# Patient Record
Sex: Female | Born: 1990 | Race: White | Hispanic: No | Marital: Single | Smoking: Never smoker
Health system: Southern US, Community
[De-identification: ages and names within clinical notes are randomized; demographics above are authoritative.]

## PROBLEM LIST (undated history)

## (undated) DIAGNOSIS — F419 Anxiety disorder, unspecified: Secondary | ICD-10-CM

## (undated) HISTORY — PX: WISDOM TOOTH EXTRACTION: SHX21

---

## 2020-07-20 ENCOUNTER — Other Ambulatory Visit: Payer: Self-pay

## 2020-07-20 ENCOUNTER — Encounter: Payer: Self-pay | Admitting: Emergency Medicine

## 2020-07-20 ENCOUNTER — Ambulatory Visit
Admission: EM | Admit: 2020-07-20 | Discharge: 2020-07-20 | Disposition: A | Discharge status: Home / Self Care | Payer: BC Managed Care – PPO

## 2020-07-20 ENCOUNTER — Ambulatory Visit (INDEPENDENT_AMBULATORY_CARE_PROVIDER_SITE_OTHER): Payer: BC Managed Care – PPO

## 2020-07-20 DIAGNOSIS — W19XXXA Unspecified fall, initial encounter: Secondary | ICD-10-CM | POA: Diagnosis not present

## 2020-07-20 DIAGNOSIS — S82145A Nondisplaced bicondylar fracture of left tibia, initial encounter for closed fracture: Secondary | ICD-10-CM | POA: Diagnosis not present

## 2020-07-20 DIAGNOSIS — M25571 Pain in right ankle and joints of right foot: Secondary | ICD-10-CM

## 2020-07-20 DIAGNOSIS — S8261XA Displaced fracture of lateral malleolus of right fibula, initial encounter for closed fracture: Secondary | ICD-10-CM

## 2020-07-20 DIAGNOSIS — T07XXXA Unspecified multiple injuries, initial encounter: Secondary | ICD-10-CM

## 2020-07-20 DIAGNOSIS — M25562 Pain in left knee: Secondary | ICD-10-CM | POA: Diagnosis not present

## 2020-07-20 HISTORY — DX: Anxiety disorder, unspecified: F41.9

## 2020-07-20 MED ORDER — TRAMADOL HCL 50 MG PO TABS: 100.0000 mg | ORAL_TABLET | Freq: Four times a day (QID) | ORAL | 0 refills | Status: AC | PRN

## 2020-07-20 NOTE — ED Triage Notes (Signed)
Patient states that she she was walking around the trail and fell down about 1 hour ago.  Patient c/o left leg pain, right ankle pain and has abrasions to left hand and right forearm.

## 2020-07-20 NOTE — Discharge Instructions (Addendum)
Wear your knee immobilizer to protect your left knee and do not bear weight as to prevent further injury.  You may bear weight as tolerated on your right ankle but wear your Aircast for stability.  Use your crutches to get around.  Keep your abrasions clean and dry.  Clean them daily with warm water and soap, pat them dry, and for the next 2 days apply bacitracin ointment and a nonstick dressing.  If you develop any increased pain, redness, swelling, or drainage from any of your abrasions return for reevaluation and possible antibiotic prescription.  Call Dr. Binnie Rail office tomorrow to schedule appointment.  Tell them that you were evaluated in the urgent care and that Dr. Joice Lofts wants to see you this week in clinic.

## 2020-07-20 NOTE — ED Provider Notes (Signed)
[facility]    CSN: [id number] Arrival date & time: 07/20/20  1145      History   Chief Complaint Chief Complaint  Patient presents with  . Fall  . Leg Pain    left  . Ankle Pain    right    HPI Sylvia Brown is a 30 y.o. female.   HPI   30 year old female here for evaluation of right ankle and left knee pain.  Patient reports that she was running on a paved path, got too close to the edge, and tripped and fell.  Patient reports that she landed on her left knee as well as her left hand and forearm.  Patient has has pain and swelling to the outside of her right ankle, pain to the front part of her left knee, abrasions to her right forearm, left knee, and left palm.  Patient states that she was able to bear some weight on her right ankle but she was unable to bear weight on her left knee.  Patient states that she has had some intermittent numbness and tingling in the toes of both feet.  Past Medical History:  Diagnosis Date  . Anxiety     There are no problems to display for this patient.   Past Surgical History:  Procedure Laterality Date  . WISDOM TOOTH EXTRACTION      OB History   No obstetric history on file.      Home Medications    Prior to Admission medications   Medication Sig Start Date End Date Taking? Authorizing Provider  escitalopram (LEXAPRO) 10 MG tablet Take 1 tablet by mouth daily. 06/26/20  Yes [provider]  levonorgestrel (MIRENA) 20 MCG/24HR IUD  01/05/19  Yes [provider]  traMADol (ULTRAM) 50 MG tablet Take 2 tablets (100 mg total) by mouth every 6 (six) hours as needed. 07/20/20  Yes Becky Augusta, NP    Family History History reviewed. No pertinent family history.  Social History Social History   Tobacco Use  . Smoking status: Never Smoker  . Smokeless tobacco: Never Used  Vaping Use  . Vaping Use: Never used  Substance Use Topics  . Alcohol use: Yes  . Drug use: Never     Allergies    Amoxicillin   Review of Systems Review of Systems  Constitutional: Negative for activity change, appetite change and fever.  Musculoskeletal: Positive for arthralgias, joint swelling and myalgias.  Skin: Positive for color change and wound.  Neurological: Positive for numbness. Negative for syncope, weakness and headaches.  Hematological: Negative.   Psychiatric/Behavioral: Negative.      Physical Exam Triage Vital Signs ED Triage Vitals  Enc Vitals Group     BP 07/20/20 1230 97/67     Pulse Rate 07/20/20 1230 86     Resp 07/20/20 1230 14     Temp 07/20/20 1230 98.5 F (36.9 C)     Temp Source 07/20/20 1230 Oral     SpO2 07/20/20 1230 100 %     Weight 07/20/20 1227 265 lb (120.2 kg)     Height 07/20/20 1227 5\' 10"  (1.778 m)     Head Circumference --      Peak Flow --      Pain Score 07/20/20 1227 8     Pain Loc --      Pain Edu? --      Excl. in GC? --    No data found.  Updated Vital Signs BP 97/67 (BP Location:  Right Arm)   Pulse 86   Temp 98.5 F (36.9 C) (Oral)   Resp 14   Ht 5\' 10"  (1.778 m)   Wt 265 lb (120.2 kg)   SpO2 100%   BMI 38.02 kg/m   Visual Acuity Right Eye Distance:   Left Eye Distance:   Bilateral Distance:    Right Eye Near:   Left Eye Near:    Bilateral Near:     Physical Exam   UC Treatments / Results  Labs (all labs ordered are listed, but only abnormal results are displayed) Labs Reviewed - No data to display  EKG   Radiology DG Ankle Complete Right  Result Date: 07/20/2020 CLINICAL DATA:  Fall with ankle pain. EXAM: RIGHT ANKLE - COMPLETE 3+ VIEW COMPARISON:  None. FINDINGS: A 4 mm density just inferior to the distal fibula is consistent with an avulsion fracture. There is associated soft tissue swelling. There is no ankle joint dislocation. IMPRESSION: Avulsion fracture at the tip of the distal fibula. Electronically Signed   By: 07/22/2020 M.D.   On: 07/20/2020 13:44   DG Knee Complete 4 Views Left  Result Date:  07/20/2020 CLINICAL DATA:  Knee pain after a fall. EXAM: LEFT KNEE - COMPLETE 4+ VIEW COMPARISON:  None. FINDINGS: There is a moderate knee joint effusion. There are apparent cortical discontinuities of the lateral and medial tibial plateaus along the edge of the tibial spine. There is also possible cortical discontinuity along the posterior aspect of the tibial plateau on the lateral view. No definite fracture extends to the tibial metaphysis. There is no joint dislocation. IMPRESSION: Moderate knee joint effusion. Apparent cortical discontinuities of the tibial plateau along the tibial spine are concerning for nondisplaced fractures. CT of the knee could be considered for further evaluation. Electronically Signed   By: 07/22/2020 M.D.   On: 07/20/2020 13:50    Procedures Procedures (including critical care time)  Medications Ordered in UC Medications - No data to display  Initial Impression / Assessment and Plan / UC Course  I have reviewed the triage vital signs and the nursing notes.  Pertinent labs & imaging results that were available during my care of the patient were reviewed by me and considered in my medical decision making (see chart for details).   Patient is a very pleasant 30 year old female here for evaluation of pain in her right ankle, left knee, left palm, and right forearm after suffering a fall while running on a paved path earlier today.  Patient's physical exam reveals superficial abrasions to the palmar left hand and to her proximal right forearm.  Patient has swelling to the lateral malleolus of her right ankle and is tender over the posterior aspect of the distal 6 cm.  Patient has no tenderness to the calcaneus or Achilles of her left foot, medial malleolus, midfoot, arch, or toes.  Patient has full range of motion of her ankle but does complain of pain with eversion.  DP and PT pulses are 2+.  Patient became pale and nauseous when trying to assess her left knee so she was  assisted to a stretcher.  Patient was provided a gown and a blanket in her pants were removed with assistance of her significant other.  Patient has abrasions to the anterior aspect of her left knee.  Patient has no tenderness with palpation of the quadriceps complex, medial or lateral joint line, popliteal space, or with varus or valgus stress.  Patient does have tenderness over  the patella with palpation as well as over the tibial tuberosity and the lateral aspect of the patellar tendon.  Unable to get patient to 90 degrees to assess for anterior and posterior cruciate laxity due to pain.  No crepitus present on exam.  DP and PT pulses are 2+ on the left as well.  Will obtain radiograph of right ankle and left knee.  Right ankle films independently reviewed and evaluated by me.  Interpretation: No evidence of fracture or dislocation.  There is soft tissue swelling to the lateral aspect of the ankle.  On talar mortise joint is normal.  Awaiting radiology overread.  Trace independently reviewed and evaluated by me.  Interpretation: There is a nondisplaced tibial plateau fracture.  No evidence of other fractures noted on films.  Awaiting radiology overread.  Radiology interpretation of right ankle films is reading an avulsion fracture of the tip of the distal fibula.  Radiology interpretation of left knee films suggestive of tibial plateau fracture.  Will place patient in Aircast and knee immobilizer with crutches.  Spoke with Dr. Joice Lofts who is in agreement with the course of action of knee immobilizer with no weightbearing on the left leg, Aircast with limited weightbearing on the right ankle, crutches, and to have patient follow-up with him in clinic next week.  Will discharge patient home to use over-the-counter Tylenol and ibuprofen as needed for mild to moderate pain and will provide tramadol prescription for severe pain.    Final Clinical Impressions(s) / UC Diagnoses   Final diagnoses:   Avulsion fracture of lateral malleolus of right fibula, closed, initial encounter  Closed nondisplaced fracture of left tibial plateau, initial encounter  Abrasions of multiple sites     Discharge Instructions     Wear your knee immobilizer to protect your left knee and do not bear weight as to prevent further injury.  You may bear weight as tolerated on your right ankle but wear your Aircast for stability.  Use your crutches to get around.  Keep your abrasions clean and dry.  Clean them daily with warm water and soap, pat them dry, and for the next 2 days apply bacitracin ointment and a nonstick dressing.  If you develop any increased pain, redness, swelling, or drainage from any of your abrasions return for reevaluation and possible antibiotic prescription.  Call Dr. Binnie Rail office tomorrow to schedule appointment.  Tell them that you were evaluated in the urgent care and that Dr. Joice Lofts wants to see you this week in clinic.    ED Prescriptions    Medication Sig Dispense Auth. Provider   traMADol (ULTRAM) 50 MG tablet Take 2 tablets (100 mg total) by mouth every 6 (six) hours as needed. 20 tablet Becky Augusta, NP     I have reviewed the PDMP during this encounter.   Becky Augusta, NP 07/20/20 1431

## 2022-04-28 IMAGING — CR DG KNEE COMPLETE 4+V*L*
4 series · 5 of 5 positions shown · non-contrast
Comparison: None.

CLINICAL DATA: Knee pain after a fall.

EXAM:
LEFT KNEE - COMPLETE 4+ VIEW

[knee ap]
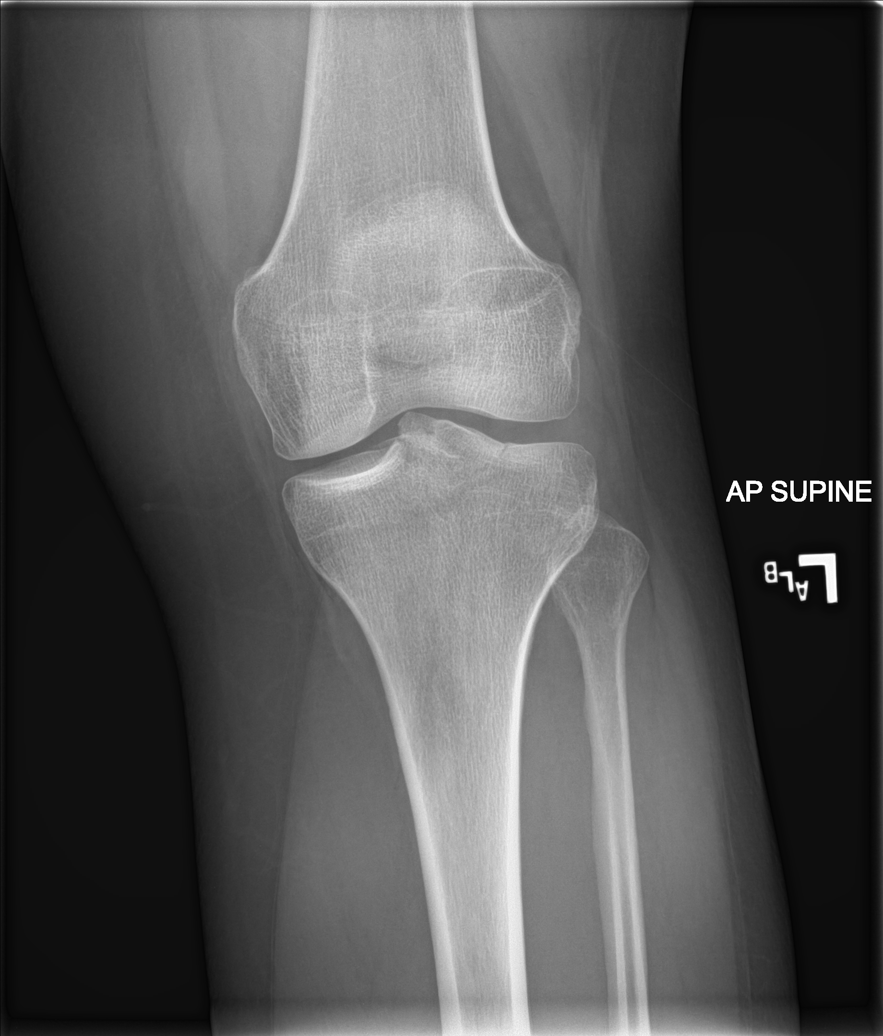

[knee lat]
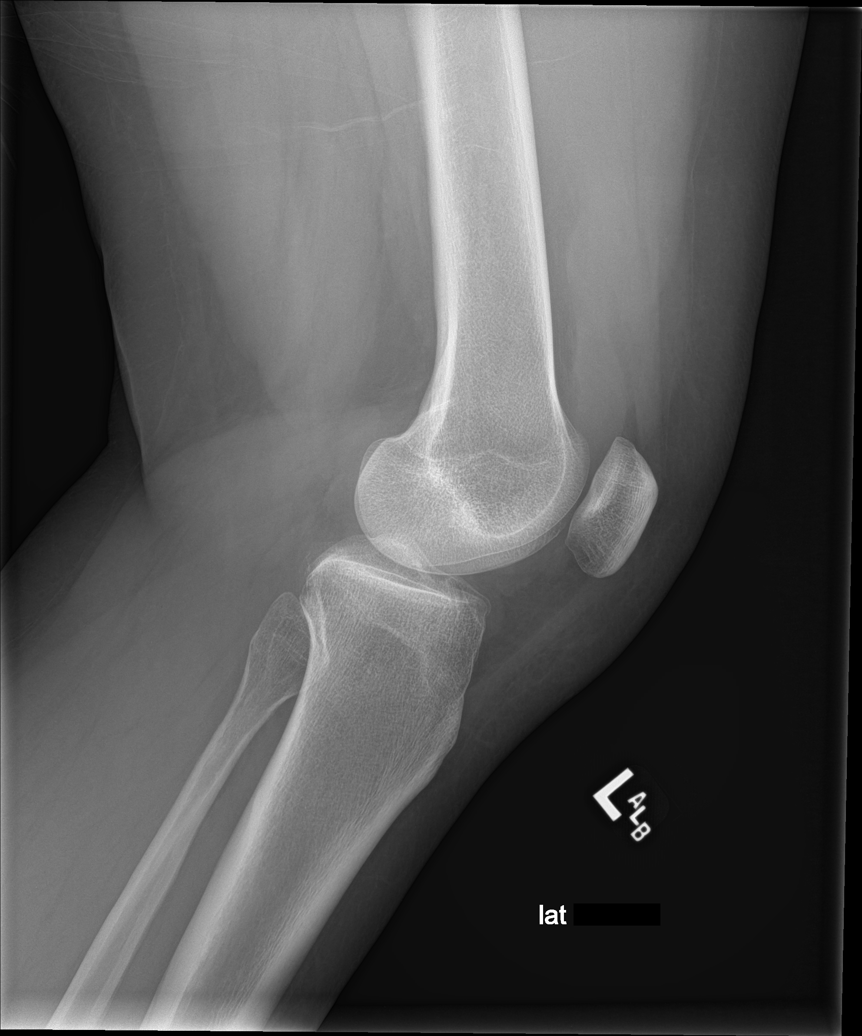

[Series 3: knee obl · 0.14mm/px · 2 of 2 slices shown (1 of 2)]
[im 1/2]
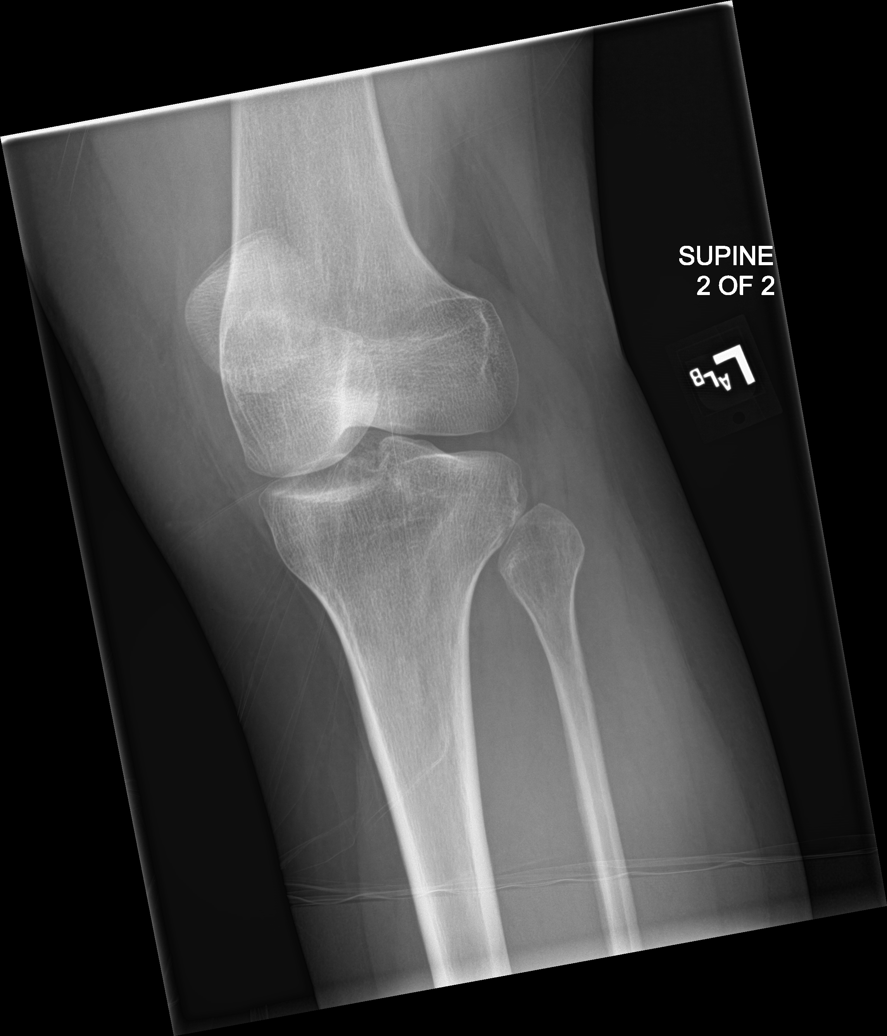
[im 2/2]
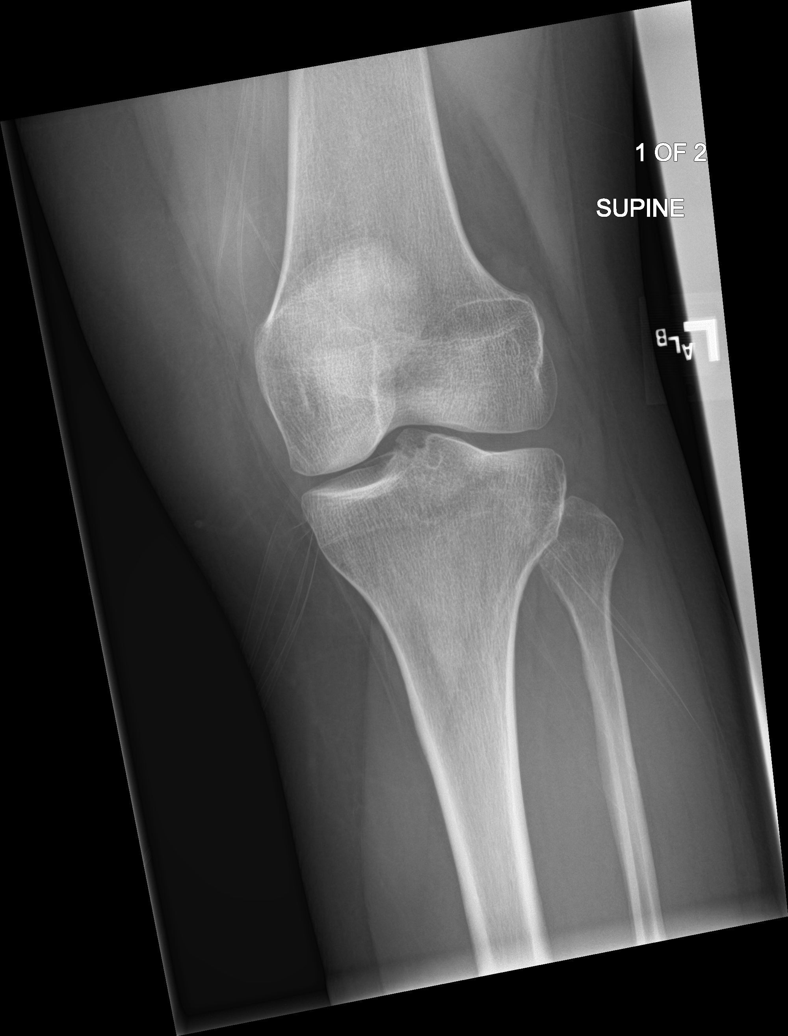

[knee obl (2 of 2)]
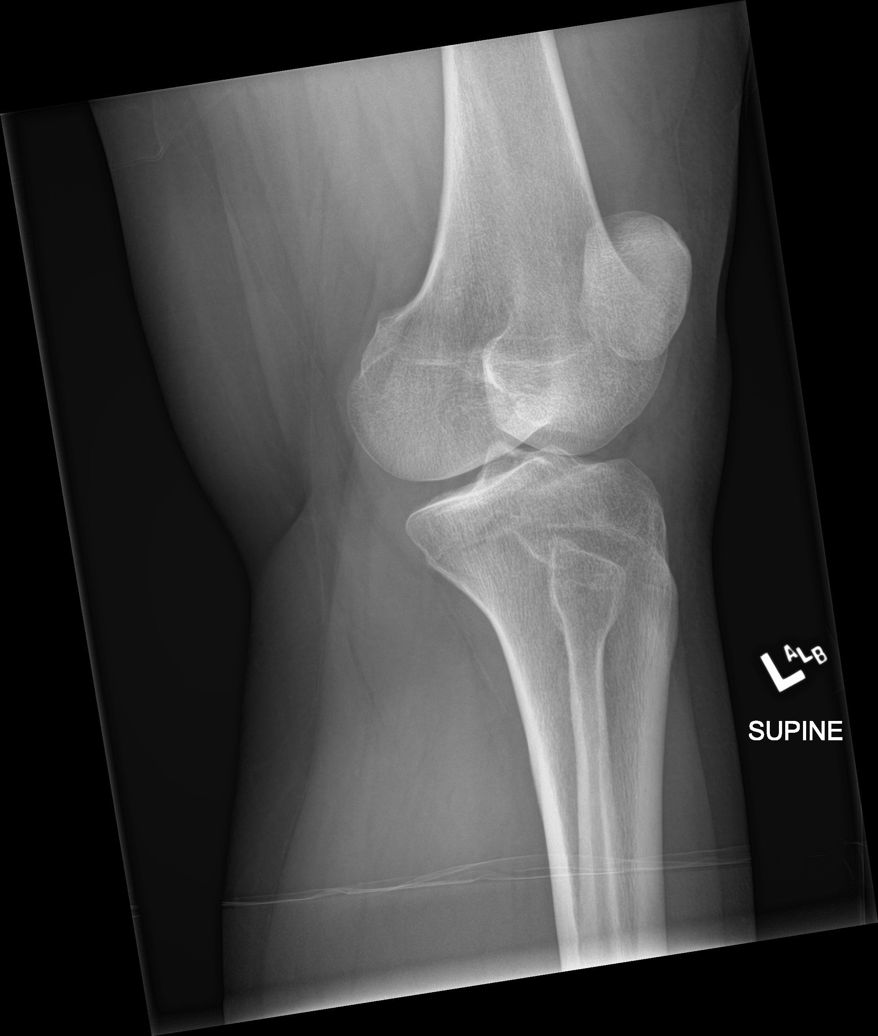

[5 of 5 positions shown; findings below may reference images not displayed]

FINDINGS: There is a moderate knee joint effusion. There are apparent cortical
discontinuities of the lateral and medial tibial plateaus along the
edge of the tibial spine. There is also possible cortical
discontinuity along the posterior aspect of the tibial plateau on
the lateral view. No definite fracture extends to the tibial
metaphysis. There is no joint dislocation.
IMPRESSION: Moderate knee joint effusion. Apparent cortical discontinuities of
the tibial plateau along the tibial spine are concerning for
nondisplaced fractures. CT of the knee could be considered for
further evaluation.

## 2022-04-28 IMAGING — CR DG ANKLE COMPLETE 3+V*R*
3 series · 3 of 3 positions shown · non-contrast
Comparison: None.

CLINICAL DATA: Fall with ankle pain.

EXAM:
RIGHT ANKLE - COMPLETE 3+ VIEW

[ankle ap]
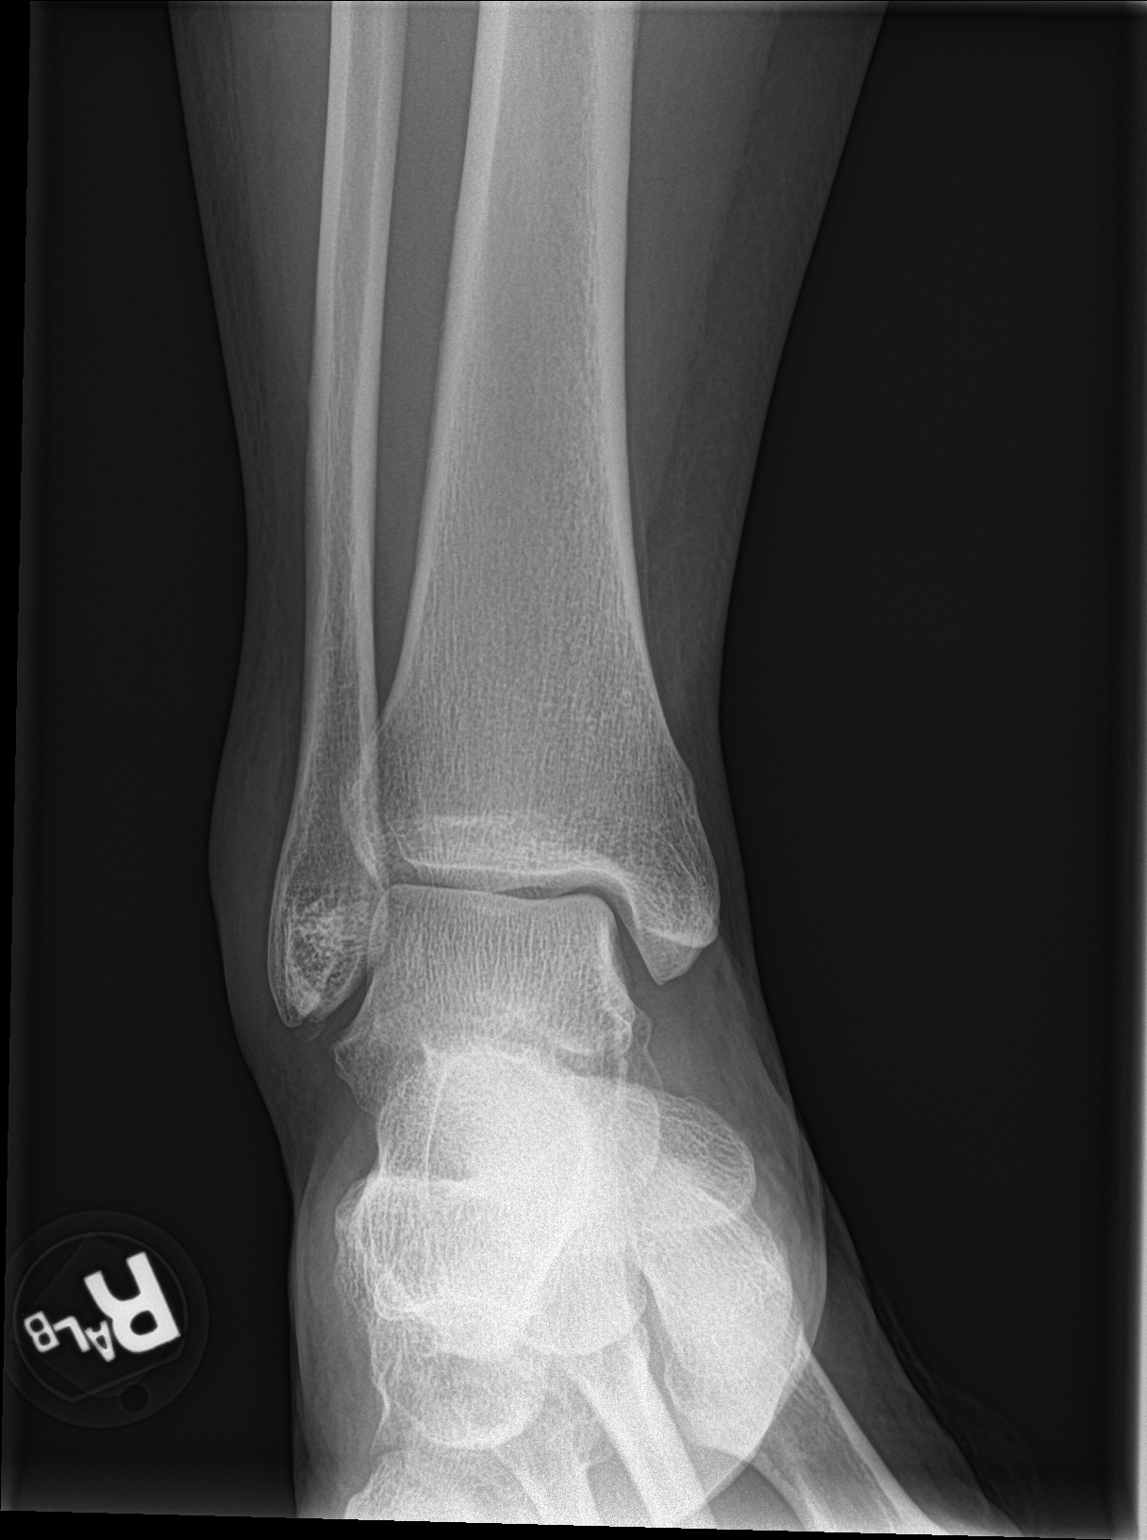

[ankle obl]
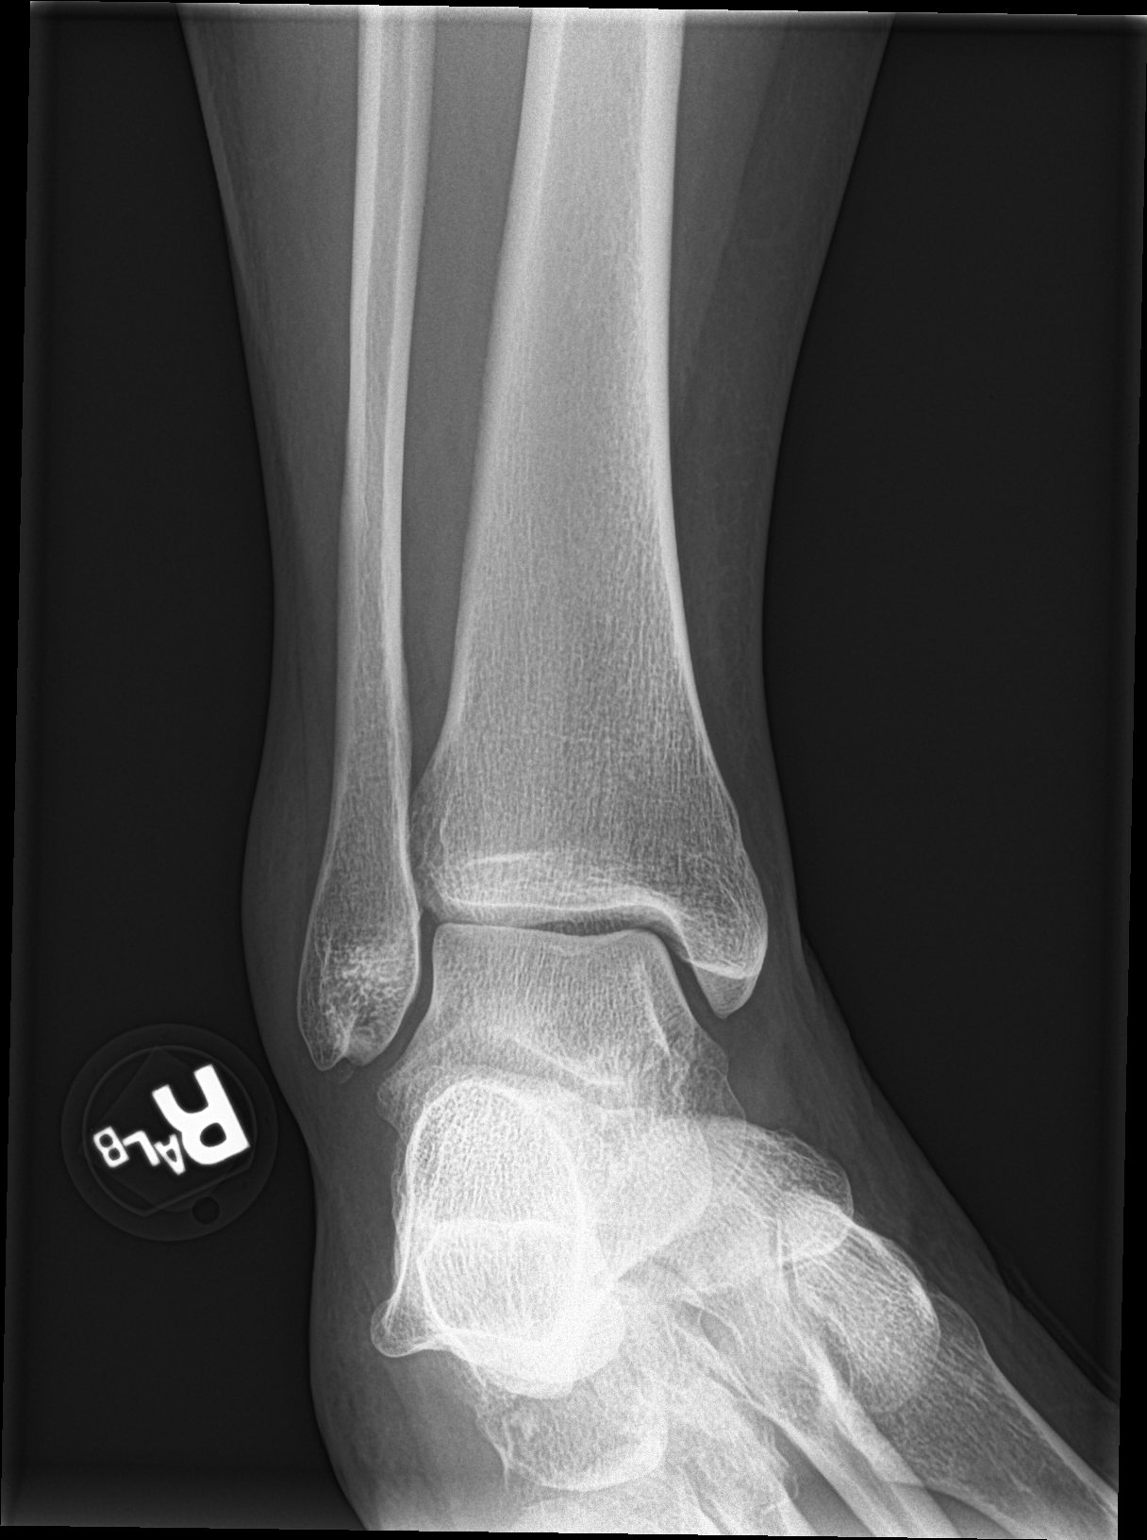

[ankle lat]
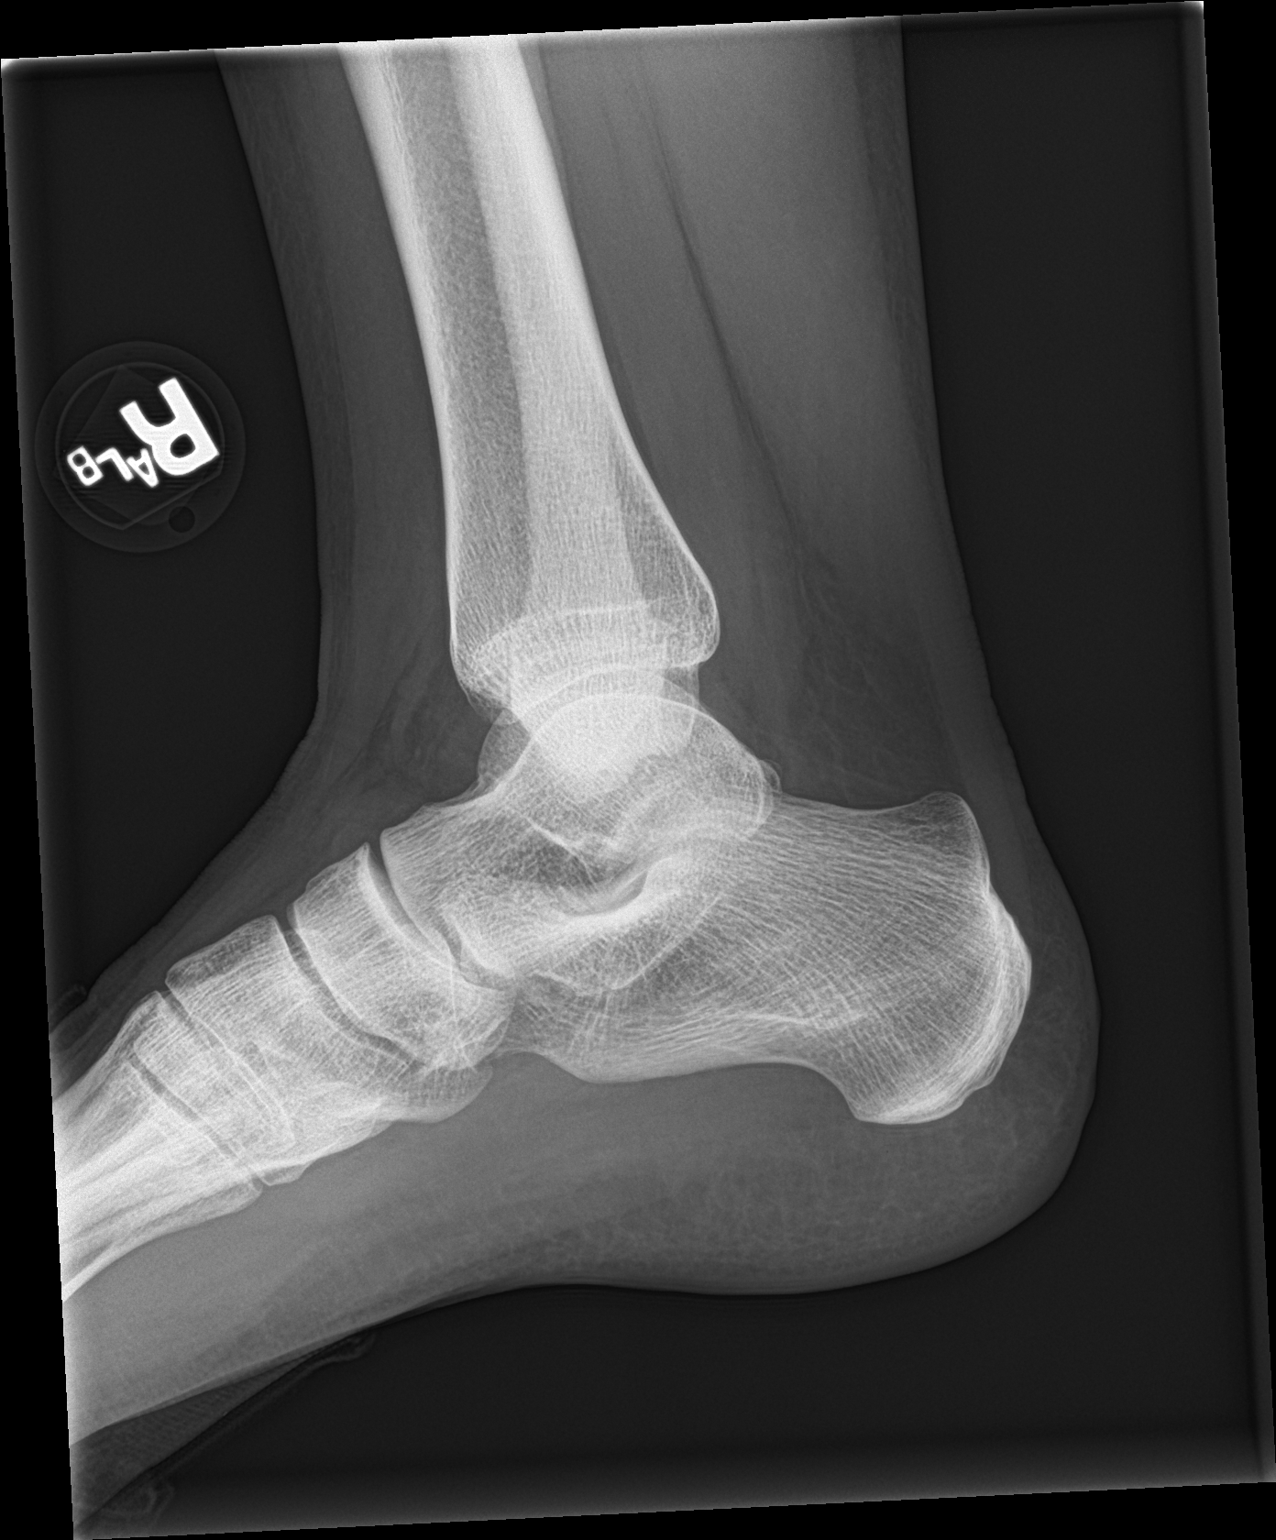

[3 of 3 positions shown; findings below may reference images not displayed]

FINDINGS: A 4 mm density just inferior to the distal fibula is consistent with
an avulsion fracture. There is associated soft tissue swelling.
There is no ankle joint dislocation.
IMPRESSION: Avulsion fracture at the tip of the distal fibula.
